# Patient Record
Sex: Female | Born: 2000 | Race: White | Hispanic: No | Marital: Single | Smoking: Never smoker
Health system: Southern US, Community
[De-identification: ages and names within clinical notes are randomized; demographics above are authoritative.]

## PROBLEM LIST (undated history)

## (undated) DIAGNOSIS — R198 Other specified symptoms and signs involving the digestive system and abdomen: Secondary | ICD-10-CM

## (undated) DIAGNOSIS — I456 Pre-excitation syndrome: Secondary | ICD-10-CM

## (undated) DIAGNOSIS — F419 Anxiety disorder, unspecified: Secondary | ICD-10-CM

## (undated) DIAGNOSIS — M797 Fibromyalgia: Secondary | ICD-10-CM

## (undated) HISTORY — PX: WISDOM TOOTH EXTRACTION: SHX21

## (undated) HISTORY — PX: CARDIAC SURGERY: SHX584

## (undated) HISTORY — PX: SKIN BIOPSY: SHX1

---
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
## 2021-01-07 ENCOUNTER — Ambulatory Visit
Admission: RE | Admit: 2021-01-07 | Discharge: 2021-01-07 | Disposition: A | Discharge status: Home / Self Care | Payer: BC Managed Care – PPO | Source: Ambulatory Visit | Attending: Family Medicine | Admitting: Family Medicine

## 2021-01-07 ENCOUNTER — Other Ambulatory Visit: Payer: Self-pay

## 2021-01-07 VITALS — BP 128/84 | HR 89 | Temp 99.5°F | Resp 16 | Ht 65.0 in | Wt 110.0 lb

## 2021-01-07 DIAGNOSIS — R300 Dysuria: Secondary | ICD-10-CM | POA: Insufficient documentation

## 2021-01-07 LAB — POCT URINALYSIS DIP (MANUAL ENTRY)
Bilirubin, UA: NEGATIVE
Blood, UA: NEGATIVE
Glucose, UA: NEGATIVE mg/dL
Nitrite, UA: NEGATIVE
Protein Ur, POC: NEGATIVE mg/dL
Spec Grav, UA: 1.025 (ref 1.010–1.025)
Urobilinogen, UA: 0.2 E.U./dL
pH, UA: 5.5 (ref 5.0–8.0)

## 2021-01-07 MED ORDER — CEPHALEXIN 500 MG PO CAPS: 500.0000 mg | ORAL_CAPSULE | Freq: Two times a day (BID) | ORAL | 0 refills | Status: AC

## 2021-01-07 NOTE — Discharge Instructions (Addendum)
Treating for a urinary tract infection Take the antibiotics as prescribed Sending for culture Follow up as needed for continued or worsening symptoms

## 2021-01-07 NOTE — ED Provider Notes (Signed)
Renaldo Fiddler    CSN: [id number] Arrival date & time: 01/07/21  1241      History   Chief Complaint Chief Complaint  Patient presents with  . Dysuria    HPI Jennifer Haynes is a 20 y.o. female.   Patient is a 20 year old female who presents today with complaints of dysuria, urinary retention, dark urine and odor.  Is been present for the past 5 days.  No associated flank pain, fever, nausea, vomiting.  No vaginal symptoms. Patient's last menstrual period was 12/24/2020.      Past Medical History:  Diagnosis Date  . Anxiety   . Fibromyalgia   . Visceral hyperalgesia   . WPW (Wolff-Parkinson-White syndrome)     There are no problems to display for this patient.   Past Surgical History:  Procedure Laterality Date  . ABLATION  2017  . CARDIAC SURGERY    . SKIN BIOPSY    . WISDOM TOOTH EXTRACTION      OB History   No obstetric history on file.      Home Medications    Prior to Admission medications   Medication Sig Start Date End Date Taking? Authorizing Provider  cephALEXin (KEFLEX) 500 MG capsule Take 1 capsule (500 mg total) by mouth 2 (two) times daily for 5 days. 01/07/21 01/12/21 Yes Clotine Heiner A, NP  escitalopram (LEXAPRO) 10 MG tablet Take 15 mg by mouth daily.   Yes [provider]  norethindrone-ethinyl estradiol (LOESTRIN FE) 1-20 MG-MCG tablet Take 1 tablet by mouth daily.   Yes [provider]    Family History Family History  Problem Relation Age of Onset  . Hypertension Father     Social History Social History   Tobacco Use  . Smoking status: Never Smoker  . Smokeless tobacco: Never Used  Vaping Use  . Vaping Use: Never used  Substance Use Topics  . Alcohol use: Not Currently  . Drug use: Never     Allergies   Gluten meal   Review of Systems Review of Systems   Physical Exam Triage Vital Signs ED Triage Vitals  Enc Vitals Group     BP 01/07/21 1303 128/84     Pulse Rate 01/07/21 1303 89      Resp 01/07/21 1303 16     Temp 01/07/21 1303 99.5 F (37.5 C)     Temp Source 01/07/21 1303 Oral     SpO2 01/07/21 1303 98 %     Weight 01/07/21 1259 110 lb (49.9 kg)     Height 01/07/21 1259 5\' 5"  (1.651 m)     Head Circumference --      Peak Flow --      Pain Score 01/07/21 1258 5     Pain Loc --      Pain Edu? --      Excl. in GC? --    No data found.  Updated Vital Signs BP 128/84 (BP Location: Left Arm)   Pulse 89   Temp 99.5 F (37.5 C) (Oral)   Resp 16   Ht 5\' 5"  (1.651 m)   Wt 110 lb (49.9 kg)   LMP 12/24/2020   SpO2 98%   BMI 18.30 kg/m   Visual Acuity Right Eye Distance:   Left Eye Distance:   Bilateral Distance:    Right Eye Near:   Left Eye Near:    Bilateral Near:     Physical Exam Vitals and nursing note reviewed.  Constitutional:  General: She is not in acute distress.    Appearance: Normal appearance. She is not ill-appearing, toxic-appearing or diaphoretic.  HENT:     Head: Normocephalic.  Eyes:     Conjunctiva/sclera: Conjunctivae normal.  Pulmonary:     Effort: Pulmonary effort is normal.  Musculoskeletal:        General: Normal range of motion.     Cervical back: Normal range of motion.  Skin:    General: Skin is warm and dry.     Findings: No rash.  Neurological:     Mental Status: She is alert.  Psychiatric:        Mood and Affect: Mood normal.      UC Treatments / Results  Labs (all labs ordered are listed, but only abnormal results are displayed) Labs Reviewed  POCT URINALYSIS DIP (MANUAL ENTRY) - Abnormal; Notable for the following components:      Result Value   Ketones, POC UA small (15) (*)    Leukocytes, UA Trace (*)    All other components within normal limits  URINE CULTURE    EKG   Radiology No results found.  Procedures Procedures (including critical care time)  Medications Ordered in UC Medications - No data to display  Initial Impression / Assessment and Plan / UC Course  I have reviewed the  triage vital signs and the nursing notes.  Pertinent labs & imaging results that were available during my care of the patient were reviewed by me and considered in my medical decision making (see chart for details).     Dysuria  Urine with trace leuks Sending for culture.  Will go ahead and treat today for UTI pending culture Recommended push fluids.  Follow up as needed for continued or worsening symptoms   Final Clinical Impressions(s) / UC Diagnoses   Final diagnoses:  Dysuria     Discharge Instructions     Treating for a urinary tract infection Take the antibiotics as prescribed Sending for culture Follow up as needed for continued or worsening symptoms     ED Prescriptions    Medication Sig Dispense Auth. Provider   cephALEXin (KEFLEX) 500 MG capsule Take 1 capsule (500 mg total) by mouth 2 (two) times daily for 5 days. 10 capsule Dahlia Byes A, NP     PDMP not reviewed this encounter.   Janace Aris, NP 01/07/21 1455

## 2021-01-07 NOTE — ED Notes (Signed)
Patient unable to urinate at this time. Cup of water given.

## 2021-01-07 NOTE — ED Triage Notes (Signed)
Pt presents today with c/o of painful and hesitation with urination x 5 days. Denies fever. +nausea

## 2021-01-10 LAB — URINE CULTURE: Culture: 10000 — AB

## 2021-03-22 ENCOUNTER — Encounter: Payer: Self-pay | Admitting: Emergency Medicine

## 2021-03-22 ENCOUNTER — Emergency Department
Admission: EM | Admit: 2021-03-22 | Discharge: 2021-03-23 | Disposition: A | Discharge status: Home / Self Care | Payer: BC Managed Care – PPO | Attending: Emergency Medicine | Admitting: Emergency Medicine

## 2021-03-22 ENCOUNTER — Other Ambulatory Visit: Payer: Self-pay

## 2021-03-22 DIAGNOSIS — N390 Urinary tract infection, site not specified: Secondary | ICD-10-CM | POA: Insufficient documentation

## 2021-03-22 DIAGNOSIS — R110 Nausea: Secondary | ICD-10-CM | POA: Insufficient documentation

## 2021-03-22 DIAGNOSIS — R519 Headache, unspecified: Secondary | ICD-10-CM | POA: Insufficient documentation

## 2021-03-22 DIAGNOSIS — R42 Dizziness and giddiness: Secondary | ICD-10-CM | POA: Diagnosis not present

## 2021-03-23 ENCOUNTER — Emergency Department: Payer: BC Managed Care – PPO

## 2021-03-23 LAB — COMPREHENSIVE METABOLIC PANEL
ALT: 11 U/L (ref 0–44)
AST: 22 U/L (ref 15–41)
Albumin: 3.6 g/dL (ref 3.5–5.0)
Alkaline Phosphatase: 38 U/L (ref 38–126)
Anion gap: 7 (ref 5–15)
BUN: 9 mg/dL (ref 6–20)
CO2: 24 mmol/L (ref 22–32)
Calcium: 8.4 mg/dL — ABNORMAL LOW (ref 8.9–10.3)
Chloride: 106 mmol/L (ref 98–111)
Creatinine, Ser: 0.64 mg/dL (ref 0.44–1.00)
GFR, Estimated: >60 mL/min (ref >60)
Glucose, Bld: 104 mg/dL — ABNORMAL HIGH (ref 70–99)
Potassium: 3.5 mmol/L (ref 3.5–5.1)
Sodium: 137 mmol/L (ref 135–145)
Total Bilirubin: 0.9 mg/dL (ref 0.3–1.2)
Total Protein: 6.7 g/dL (ref 6.5–8.1)

## 2021-03-23 LAB — URINALYSIS, COMPLETE (UACMP) WITH MICROSCOPIC
Bacteria, UA: NONE SEEN
Bilirubin Urine: NEGATIVE
Glucose, UA: NEGATIVE mg/dL
Hgb urine dipstick: NEGATIVE
Ketones, ur: NEGATIVE mg/dL
Nitrite: NEGATIVE
Protein, ur: NEGATIVE mg/dL
Specific Gravity, Urine: 1.012 (ref 1.005–1.030)
pH: 9 — ABNORMAL HIGH (ref 5.0–8.0)

## 2021-03-23 LAB — CBC WITH DIFFERENTIAL/PLATELET
Abs Immature Granulocytes: 0.06 10*3/uL (ref 0.00–0.07)
Basophils Absolute: 0.1 10*3/uL (ref 0.0–0.1)
Basophils Relative: 1 %
Eosinophils Absolute: 0.1 10*3/uL (ref 0.0–0.5)
Eosinophils Relative: 1 %
HCT: 36.5 % (ref 36.0–46.0)
Hemoglobin: 12 g/dL (ref 12.0–15.0)
Immature Granulocytes: 0 %
Lymphocytes Relative: 11 %
Lymphs Abs: 1.8 10*3/uL (ref 0.7–4.0)
MCH: 28.7 pg (ref 26.0–34.0)
MCHC: 32.9 g/dL (ref 30.0–36.0)
MCV: 87.3 fL (ref 80.0–100.0)
Monocytes Absolute: 1.1 10*3/uL — ABNORMAL HIGH (ref 0.1–1.0)
Monocytes Relative: 7 %
Neutro Abs: 12.7 10*3/uL — ABNORMAL HIGH (ref 1.7–7.7)
Neutrophils Relative %: 80 %
Platelets: 276 10*3/uL (ref 150–400)
RBC: 4.18 MIL/uL (ref 3.87–5.11)
RDW: 12.2 % (ref 11.5–15.5)
WBC: 15.8 10*3/uL — ABNORMAL HIGH (ref 4.0–10.5)
nRBC: 0 % (ref 0.0–0.2)

## 2021-03-23 LAB — TROPONIN I (HIGH SENSITIVITY): Troponin I (High Sensitivity): <2 ng/L (ref <18)

## 2021-03-23 LAB — POC URINE PREG, ED: Preg Test, Ur: NEGATIVE

## 2021-03-23 MED ORDER — ONDANSETRON 4 MG PO TBDP: 4.0000 mg | ORAL_TABLET | Freq: Three times a day (TID) | ORAL | 0 refills | Status: AC | PRN

## 2021-03-23 MED ORDER — MECLIZINE HCL 25 MG PO TABS: 25.0000 mg | ORAL_TABLET | Freq: Three times a day (TID) | ORAL | 0 refills | Status: AC | PRN

## 2021-03-23 MED ORDER — ONDANSETRON HCL 4 MG/2ML IJ SOLN
4.0000 mg | Freq: Once | INTRAMUSCULAR | Status: AC
  Administered 2021-03-23: 4 mg via INTRAVENOUS
  Filled 2021-03-23: qty 2

## 2021-03-23 MED ORDER — SODIUM CHLORIDE 0.9 % IV BOLUS
1000.0000 mL | Freq: Once | INTRAVENOUS | Status: AC
  Administered 2021-03-23: 1000 mL via INTRAVENOUS

## 2021-03-23 MED ORDER — FOSFOMYCIN TROMETHAMINE 3 G PO PACK
3.0000 g | PACK | Freq: Once | ORAL | Status: AC
  Administered 2021-03-23: 3 g via ORAL
  Filled 2021-03-23: qty 3

## 2021-03-23 MED ORDER — MECLIZINE HCL 25 MG PO TABS
25.0000 mg | ORAL_TABLET | Freq: Once | ORAL | Status: AC
  Administered 2021-03-23: 25 mg via ORAL
  Filled 2021-03-23: qty 1

## 2021-03-23 NOTE — Discharge Instructions (Signed)
You may take medicines as needed for dizziness and nausea (Meclizine/Zofran #20).  Drink plenty of fluids daily.  Return to the ER for worsening symptoms, persistent vomiting, lethargy, difficulty breathing or other concerns.

## 2021-03-23 NOTE — ED Provider Notes (Signed)
[facility] Emergency Department Provider Note   ____________________________________________   Event Date/Time   First MD Initiated Contact with Patient 03/22/21 2323     (approximate)  I have reviewed the triage vital signs and the nursing notes.   HISTORY  Chief Complaint Dizziness and Headache    HPI Jennifer Haynes is a 20 y.o. female brought to the ED via EMS from [facility] with a chief complaint of headache, dizziness and nausea.  Patient reports sudden onset of the above symptoms while she was at rest.  Describes dizziness as a sense of motion and worse upon turning her head from side to side.  Symptoms associated with nausea without vomiting.  EMS reports positive orthostatics; IV fluids initiated.  Patient does note she struck the back of her head 2 days ago on a metal closet shelf without LOC but the impact was hard enough to cause some transient blurry vision.  Denies recent fever, cough, chest pain, shortness of breath, abdominal pain, vomiting or dysuria.         Past Medical History:  Diagnosis Date  . Anxiety   . Fibromyalgia   . Visceral hyperalgesia   . WPW (Wolff-Parkinson-White syndrome)     There are no problems to display for this patient.   Past Surgical History:  Procedure Laterality Date  . ABLATION  2017  . CARDIAC SURGERY    . SKIN BIOPSY    . WISDOM TOOTH EXTRACTION      Prior to Admission medications   Medication Sig Start Date End Date Taking? Authorizing Provider  escitalopram (LEXAPRO) 10 MG tablet Take 15 mg by mouth daily.    [provider]  norethindrone-ethinyl estradiol (LOESTRIN FE) 1-20 MG-MCG tablet Take 1 tablet by mouth daily.    [provider]    Allergies Gluten meal  Family History  Problem Relation Age of Onset  . Hypertension Father     Social History Social History   Tobacco Use  . Smoking status: Never Smoker  . Smokeless tobacco: Never Used  Vaping Use   . Vaping Use: Never used  Substance Use Topics  . Alcohol use: Not Currently  . Drug use: Never    Review of Systems  Constitutional: No fever/chills Eyes: No visual changes. ENT: No sore throat. Cardiovascular: Denies chest pain. Respiratory: Denies shortness of breath. Gastrointestinal: No abdominal pain.  Positive for nausea, no vomiting.  No diarrhea.  No constipation. Genitourinary: Negative for dysuria. Musculoskeletal: Negative for back pain. Skin: Negative for rash. Neurological: Positive for headache and dizziness.  Negative for focal weakness or numbness.   ____________________________________________   PHYSICAL EXAM:  VITAL SIGNS: ED Triage Vitals  Enc Vitals Group     BP 03/22/21 2326 112/67     Pulse Rate 03/22/21 2326 92     Resp 03/22/21 2326 18     Temp 03/22/21 2326 98.5 F (36.9 C)     Temp Source 03/22/21 2326 Oral     SpO2 03/22/21 2326 100 %     Weight 03/22/21 2326 120 lb (54.4 kg)     Height 03/22/21 2326 5\' 5"  (1.651 m)     Head Circumference --      Peak Flow --      Pain Score 03/22/21 2344 7     Pain Loc --      Pain Edu? --      Excl. in GC? --     Constitutional: Alert and oriented. Well appearing and  in no acute distress. Eyes: Conjunctivae are normal. PERRL. EOMI. Head: Atraumatic. Nose: No congestion/rhinnorhea. Mouth/Throat: Mucous membranes are moist.   Neck: No stridor.  No cervical spine tenderness to palpation.  No carotid bruits.  Supple neck without meningismus. Cardiovascular: Normal rate, regular rhythm. Grossly normal heart sounds.  Good peripheral circulation. Respiratory: Normal respiratory effort.  No retractions. Lungs CTAB. Gastrointestinal: Soft and nontender. No distention. No abdominal bruits. No CVA tenderness. Musculoskeletal: No lower extremity tenderness nor edema.  No joint effusions. Neurologic: Alert and oriented x3.  CN II to XII grossly intact.  Normal speech and language. No gross focal neurologic  deficits are appreciated. No gait instability. Skin:  Skin is warm, dry and intact. No rash noted.  No petechiae. Psychiatric: Mood and affect are normal. Speech and behavior are normal.  ____________________________________________   LABS (all labs ordered are listed, but only abnormal results are displayed)  Labs Reviewed  CBC WITH DIFFERENTIAL/PLATELET - Abnormal; Notable for the following components:      Result Value   WBC 15.8 (*)    Neutro Abs 12.7 (*)    Monocytes Absolute 1.1 (*)    All other components within normal limits  COMPREHENSIVE METABOLIC PANEL - Abnormal; Notable for the following components:   Glucose, Bld 104 (*)    Calcium 8.4 (*)    All other components within normal limits  URINALYSIS, COMPLETE (UACMP) WITH MICROSCOPIC - Abnormal; Notable for the following components:   Color, Urine YELLOW (*)    APPearance CLEAR (*)    pH 9.0 (*)    Leukocytes,Ua TRACE (*)    All other components within normal limits  POC URINE PREG, ED  TROPONIN I (HIGH SENSITIVITY)  TROPONIN I (HIGH SENSITIVITY)   ____________________________________________  EKG  ED ECG REPORT I, Carlis Burnsworth J, the attending physician, personally viewed and interpreted this ECG.   Date: 03/23/2021  EKG Time: 2325  Rate: 86  Rhythm: normal EKG, normal sinus rhythm  Axis: Normal  Intervals:none  ST&T Change: Nonspecific  ____________________________________________  RADIOLOGY I, Annet Manukyan J, personally viewed and evaluated these images (plain radiographs) as part of my medical decision making, as well as reviewing the written report by the radiologist.  ED MD interpretation: No ICH  Official radiology report(s): CT Head Wo Contrast  Result Date: 03/23/2021 CLINICAL DATA:  Dizziness, nausea, headache, reported head strike last Thursday without other injury since. Positive orthostatics. EXAM: CT HEAD WITHOUT CONTRAST TECHNIQUE: Contiguous axial images were obtained from the base of the  skull through the vertex without intravenous contrast. COMPARISON:  None. FINDINGS: Brain: No evidence of acute infarction, hemorrhage, hydrocephalus, extra-axial collection, visible mass lesion or mass effect. Cavum septum pellucidum et vergae, a normal anatomic variant. Cerebellar tonsils normally positioned. Midline intracranial structures are otherwise unremarkable. Vascular: No hyperdense vessel or unexpected calcification. Skull: No significant scalp swelling or hematoma. No calvarial fracture or other acute or worrisome osseous abnormality. Sinuses/Orbits: Paranasal sinuses and mastoid air cells are predominantly clear. Included orbital structures are unremarkable. Other: None. IMPRESSION: No acute intracranial abnormality. No significant scalp swelling or calvarial fracture. Electronically Signed   By: Kreg Shropshire M.D.   On: 03/23/2021 01:19    ____________________________________________   PROCEDURES  Procedure(s) performed (including Critical Care):  .1-3 Lead EKG Interpretation Performed by: Irean Hong, MD Authorized by: Irean Hong, MD     Interpretation: normal     ECG rate:  85   ECG rate assessment: normal     Rhythm: sinus rhythm  Ectopy: none     Conduction: normal   Comments:     Patient placed on cardiac monitor to evaluate for arrhythmias     ____________________________________________   INITIAL IMPRESSION / ASSESSMENT AND PLAN / ED COURSE  As part of my medical decision making, I reviewed the following data within the electronic MEDICAL RECORD NUMBER Nursing notes reviewed and incorporated, Labs reviewed, EKG interpreted, Old chart reviewed, Radiograph reviewed and Notes from prior ED visits     20 year old female presenting with sudden onset dizziness, nausea and headache. Differential diagnosis includes, but is not limited to, BPV, intracranial hemorrhage, meningitis/encephalitis, previous head trauma, cavernous venous thrombosis, tension headache, temporal  arteritis, migraine or migraine equivalent, idiopathic intracranial hypertension, and non-specific headache.  Patient was orthostatic by EMS.  Will continue IV fluid resuscitation.  Add meclizine, IV Zofran.  Obtain basic lab work, CT head.  Will reassess.  Clinical Course as of 03/23/21 0152  Sun Mar 23, 2021  0151 Patient feeling better.  Updated her on all test results.  Fosfomycin given prior to discharge.  Will discharge home with prescriptions for meclizine and Zofran to use as needed.  Strict return precautions given.  Patient verbalizes understanding and agrees with plan of care. [JS]    Clinical Course User Index [JS] Irean Hong, MD     ____________________________________________   FINAL CLINICAL IMPRESSION(S) / ED DIAGNOSES  Final diagnoses:  Dizziness  Orthostatic dizziness  Urinary tract infection without hematuria, site unspecified     ED Discharge Orders    None      *Please note:  Jennifer Haynes was evaluated in Emergency Department on 03/23/2021 for the symptoms described in the history of present illness. She was evaluated in the context of the global COVID-19 pandemic, which necessitated consideration that the patient might be at risk for infection with the SARS-CoV-2 virus that causes COVID-19. Institutional protocols and algorithms that pertain to the evaluation of patients at risk for COVID-19 are in a state of rapid change based on information released by regulatory bodies including the CDC and federal and state organizations. These policies and algorithms were followed during the patient's care in the ED.  Some ED evaluations and interventions may be delayed as a result of limited staffing during and the pandemic.*   Note:  This document was prepared using Dragon voice recognition software and may include unintentional dictation errors.   Irean Hong, MD 03/23/21 [phone]

## 2021-03-23 NOTE — ED Triage Notes (Signed)
[facility] and for last 3 hours has had continuous HA, dizziness and nausea. Pt reports she hit her head last Thursday but no other injury since. Pt denies ETOH as well as substance abuse. Pt with positive orthostatics with EMS. MD at the bedside for further evaluation.

## 2021-10-02 ENCOUNTER — Other Ambulatory Visit: Payer: Self-pay | Admitting: Unknown Physician Specialty

## 2021-10-02 ENCOUNTER — Other Ambulatory Visit (HOSPITAL_BASED_OUTPATIENT_CLINIC_OR_DEPARTMENT_OTHER): Payer: Self-pay | Admitting: Unknown Physician Specialty

## 2021-10-02 DIAGNOSIS — R42 Dizziness and giddiness: Secondary | ICD-10-CM

## 2021-10-05 ENCOUNTER — Other Ambulatory Visit: Payer: Self-pay

## 2021-10-05 ENCOUNTER — Ambulatory Visit
Admission: RE | Admit: 2021-10-05 | Discharge: 2021-10-05 | Disposition: A | Discharge status: Home / Self Care | Payer: BC Managed Care – PPO | Source: Ambulatory Visit | Attending: Unknown Physician Specialty | Admitting: Unknown Physician Specialty

## 2021-10-05 DIAGNOSIS — R42 Dizziness and giddiness: Secondary | ICD-10-CM | POA: Insufficient documentation

## 2021-10-05 MED ORDER — GADOBUTROL 1 MMOL/ML IV SOLN
5.0000 mL | Freq: Once | INTRAVENOUS | Status: AC | PRN
  Administered 2021-10-05: 7.5 mL via INTRAVENOUS

## 2022-03-31 IMAGING — CT CT HEAD W/O CM
3 series · 16 of 47 positions shown, 19 images · non-contrast
Comparison: None.

CLINICAL DATA: Dizziness, nausea, headache, reported head strike
[REDACTED] without other injury since. Positive orthostatics.

EXAM:
CT HEAD WITHOUT CONTRAST
TECHNIQUE: Contiguous axial images were obtained from the base of the skull
through the vertex without intravenous contrast.

[Series 2: head wo · axial · 0.42mm/px · z∈[-188,-53]mm · 10 of 33 slices shown, 13 images]
[im 3/33  brain]
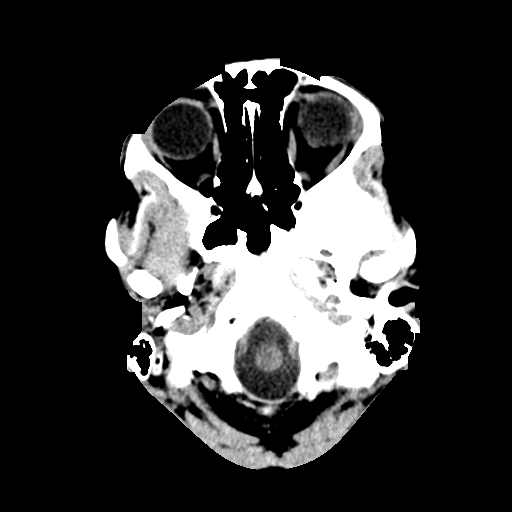
[im 3/33  bone]
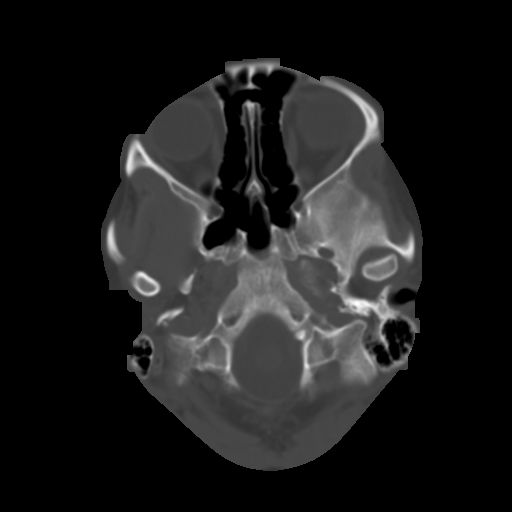
[im 6/33  brain]
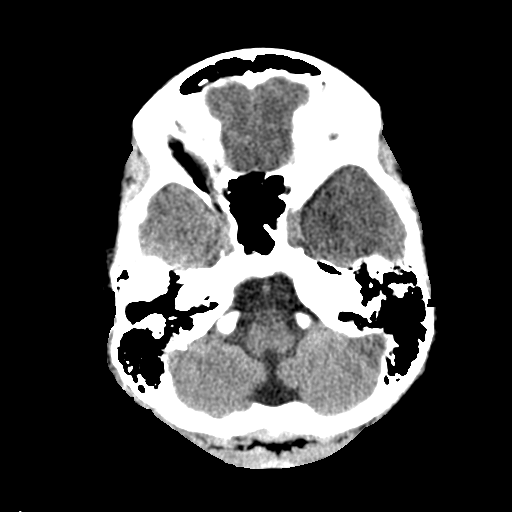
[im 9/33  brain]
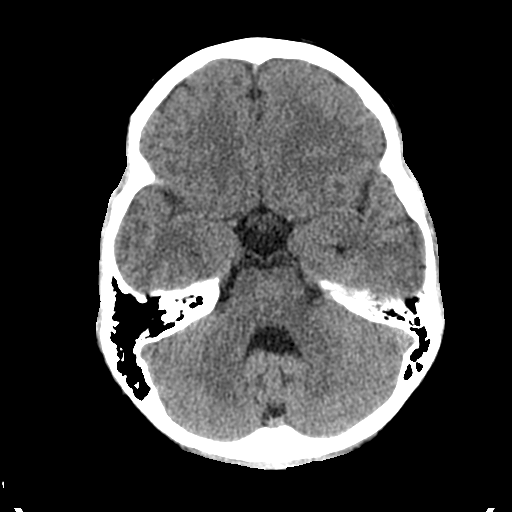
[im 12/33  brain]
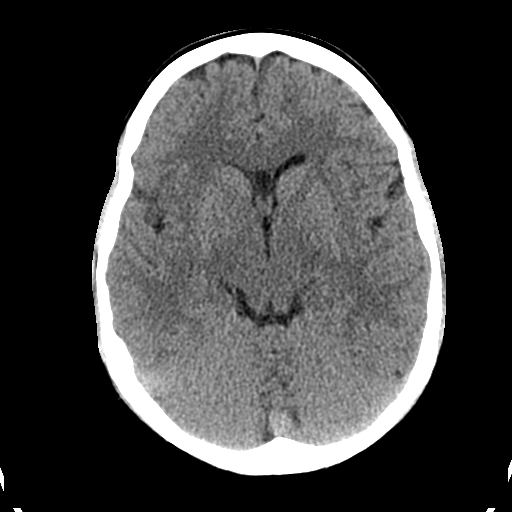
[im 15/33  brain]
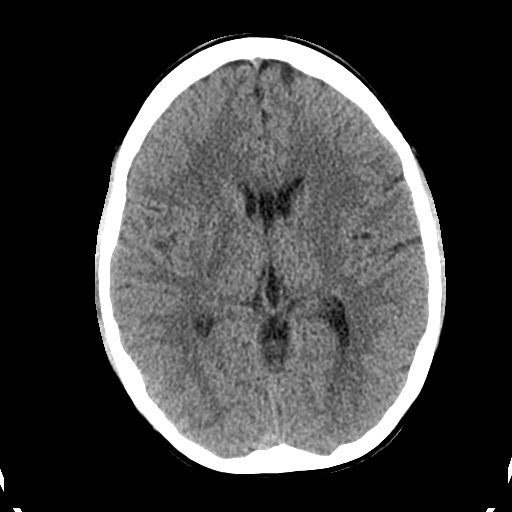
[im 15/33  bone]
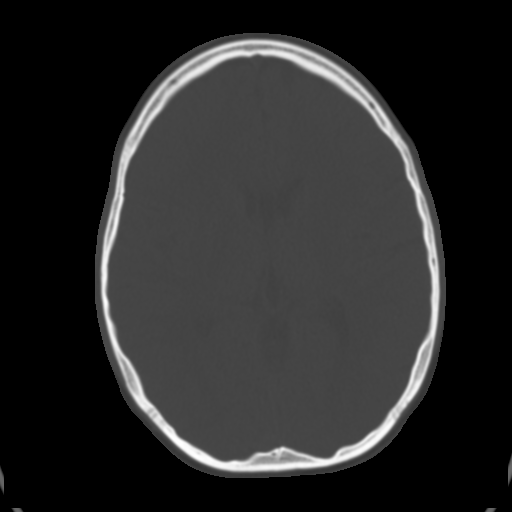
[im 18/33  brain]
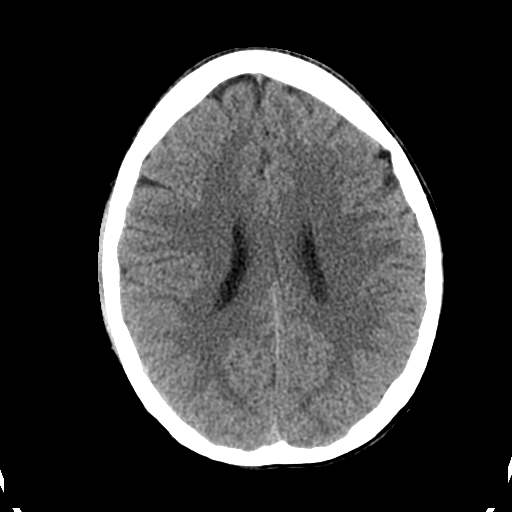
[im 21/33  brain]
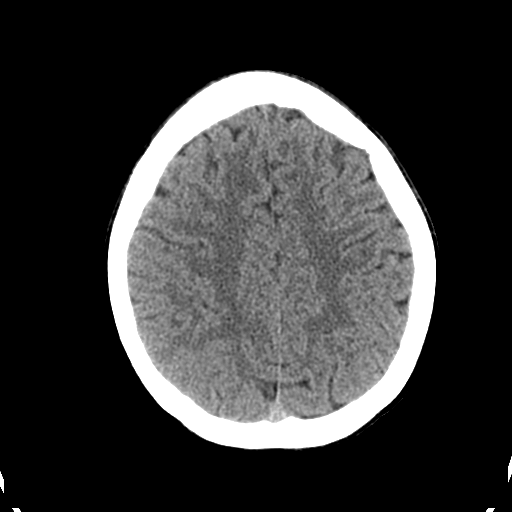
[im 25/33  brain]
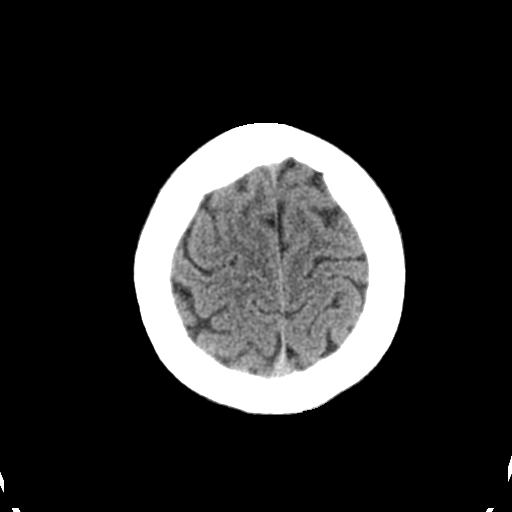
[im 27/33  brain]
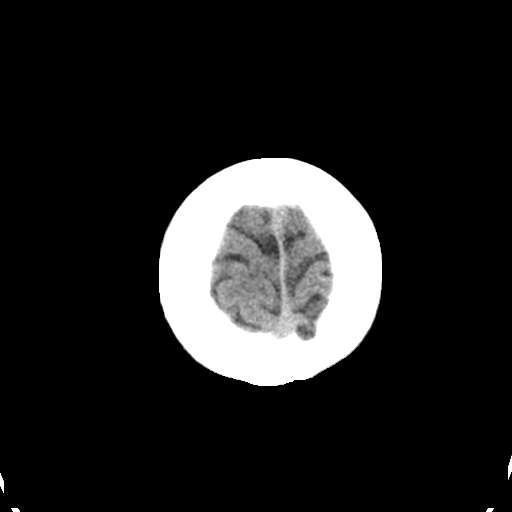
[im 27/33  bone]
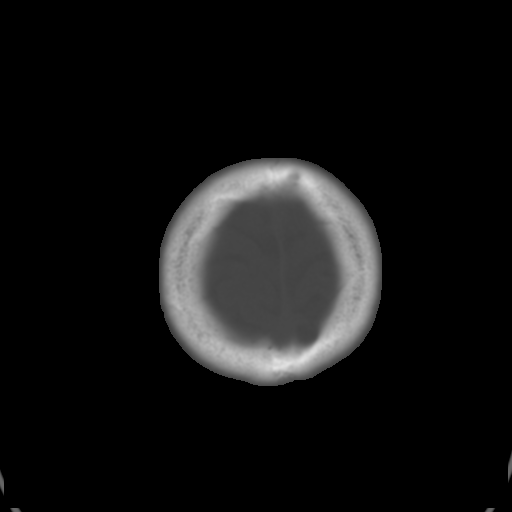
[im 30/33  brain]
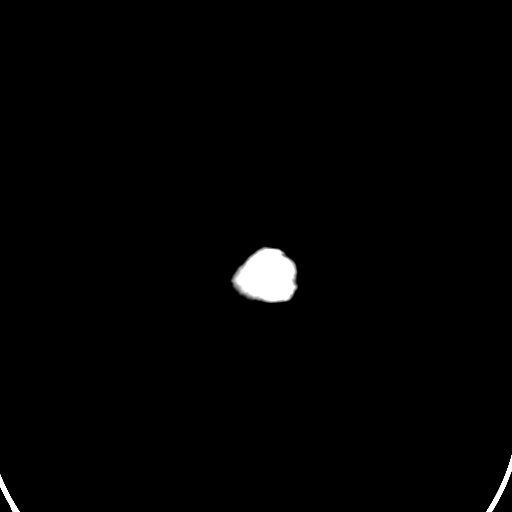

[Series 4: coronal soft tissue · coronal · 0.32mm/px · 3 of 69 slices shown]
[im 23/69  brain]
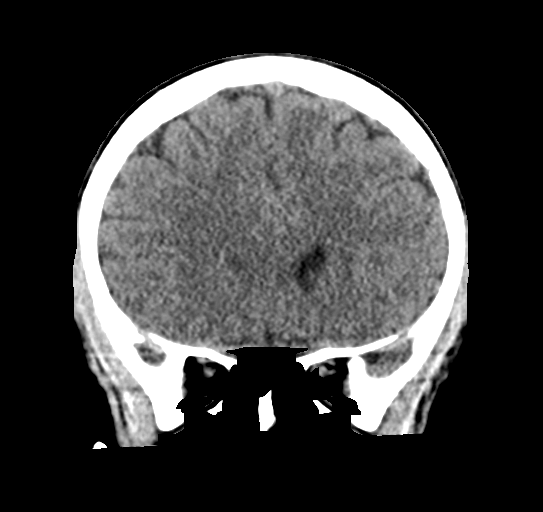
[im 31/69  brain]
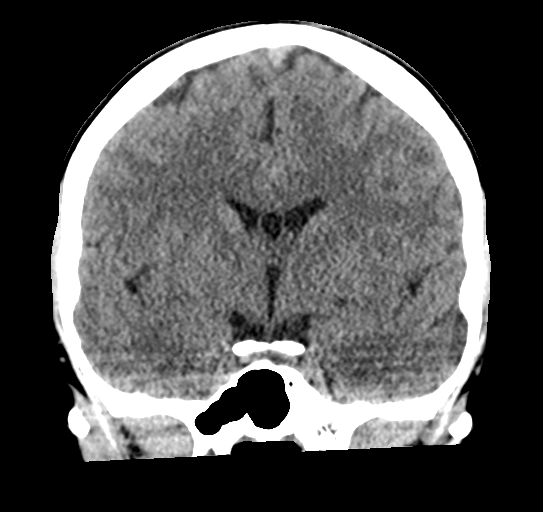
[im 38/69  brain]
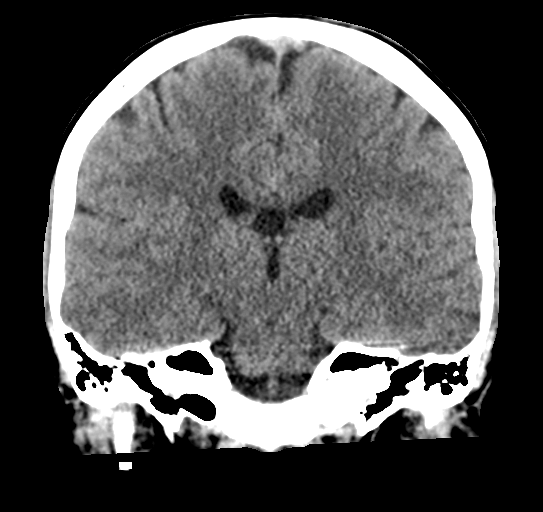

[Series 5: sagittal soft tissue · sagittal · 0.34mm/px · 3 of 58 slices shown]
[im 20/58  brain]
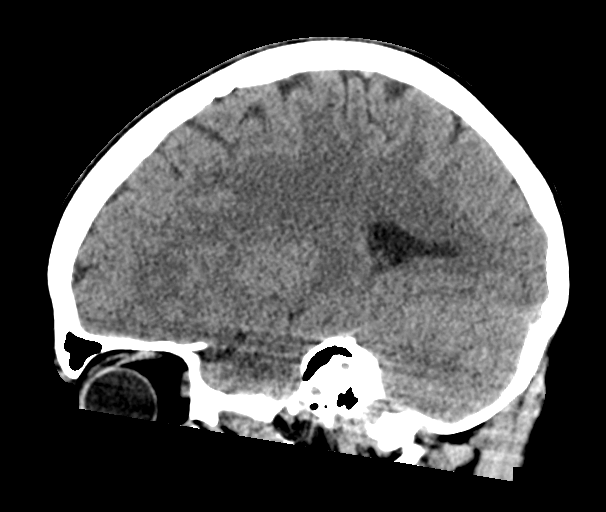
[im 29/58  brain]
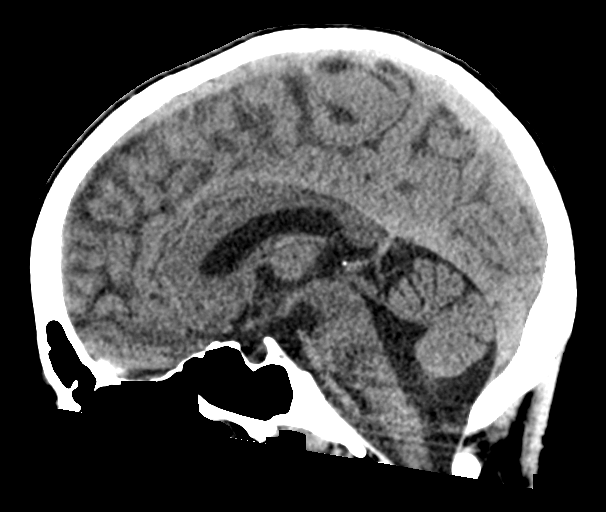
[im 39/58  brain]
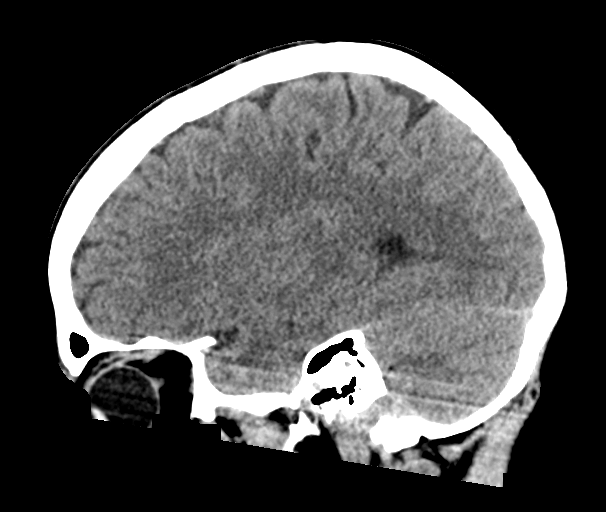

[16 of 47 positions shown; findings below may reference images not displayed]

FINDINGS: Brain: No evidence of acute infarction, hemorrhage, hydrocephalus,
extra-axial collection, visible mass lesion or mass effect. Cavum
septum pellucidum et vergae, a normal anatomic variant. Cerebellar
tonsils normally positioned. Midline intracranial structures are
otherwise unremarkable.

Vascular: No hyperdense vessel or unexpected calcification.

Skull: No significant scalp swelling or hematoma. No calvarial
fracture or other acute or worrisome osseous abnormality.

Sinuses/Orbits: Paranasal sinuses and mastoid air cells are
predominantly clear. Included orbital structures are unremarkable.

Other: None.
IMPRESSION: No acute intracranial abnormality.

No significant scalp swelling or calvarial fracture.
